# Patient Record
Sex: Male | Born: 1940 | Race: White | Hispanic: No | Marital: Single | Smoking: Never smoker
Health system: Southern US, Community
[De-identification: ages and names within clinical notes are randomized; demographics above are authoritative.]

---

## 2011-10-18 ENCOUNTER — Ambulatory Visit (INDEPENDENT_AMBULATORY_CARE_PROVIDER_SITE_OTHER): Payer: Medicare Other | Admitting: Family Medicine

## 2011-10-18 ENCOUNTER — Ambulatory Visit: Payer: Medicare Other

## 2011-10-18 VITALS — BP 122/72 | HR 76 | Temp 98.1°F | Resp 16 | Ht 70.5 in | Wt 173.0 lb

## 2011-10-18 DIAGNOSIS — M25561 Pain in right knee: Secondary | ICD-10-CM

## 2011-10-18 DIAGNOSIS — M25569 Pain in unspecified knee: Secondary | ICD-10-CM

## 2011-10-18 DIAGNOSIS — M25469 Effusion, unspecified knee: Secondary | ICD-10-CM

## 2011-10-18 MED ORDER — MELOXICAM 7.5 MG PO TABS: 7.5000 mg | ORAL_TABLET | Freq: Every day | ORAL | Status: AC

## 2011-10-18 NOTE — Progress Notes (Signed)
  Subjective:    Patient ID: Thomas Byrd, male    DOB: [birth year], 71 y.o.   MRN: [id number]  HPI Thomas Byrd is a 71 y.o. male Complains of R knee pain - 2 and 1/2 weeks ago - kneeling working on floors.  Pain next day, then persistent pain, swelling for past week.   Similar sx's in past with kneeling 3 years ago - resolved on own. Swelling worse past few days.   No fever, no rash.  Otherwise feeling well. .  Feels tight.  Last power walk 48 hours ago. No locking/giving way, but feels unstable with turning/twisting. No hx of gout.    Tx: none.  Usually active - power walk, weightlifting.    Review of Systems As above.    Objective:   Physical Exam  Constitutional: He is oriented to person, place, and time. He appears well-developed and well-nourished.  HENT:  Head: Normocephalic and atraumatic.  Eyes: EOM are normal. Pupils are equal, round, and reactive to light.  Pulmonary/Chest: Effort normal.  Musculoskeletal:       Right knee: He exhibits swelling. He exhibits no ecchymosis, no erythema, normal alignment, no LCL laxity and normal patellar mobility. tenderness found.       Legs: Neurological: He is alert and oriented to person, place, and time.  Skin: Skin is warm and dry. No rash noted.  Psychiatric: He has a normal mood and affect. His behavior is normal.   [facility] reading (PRIMARY) by  Dr. Neva Seat: R knee:  Slight DJD, and calcific changes of meniscus.      Assessment & Plan:  Thomas Byrd is a 71 y.o. male 1. Knee pain, right  DG Knee Complete 4 Views Right, meloxicam (MOBIC) 7.5 MG tablet  2. 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

## 2011-10-18 NOTE — Patient Instructions (Signed)
Try the ace bandage and relative rest for next few days.  mobic - once per day as needed.  Recheck in 4 days if not improving for possible injection. Return to the clinic or go to the nearest emergency room if any of your symptoms worsen or new symptoms occur.

## 2011-10-19 ENCOUNTER — Telehealth: Payer: Self-pay

## 2011-10-19 NOTE — Telephone Encounter (Signed)
Have given to Xray

## 2011-10-19 NOTE — Telephone Encounter (Signed)
The patient called to request that a copy (cd)  of the X-rays done 10/18/11 be mailed to his home since he lives in [place].  Please call the patient at [phone] with any questions.

## 2013-10-06 IMAGING — CR DG KNEE COMPLETE 4+V*R*
4 series · 4 of 4 positions shown · non-contrast
Comparison: None.

CLINICAL DATA: Right knee pain and swelling

RIGHT KNEE - COMPLETE 4+ VIEW

[AP]
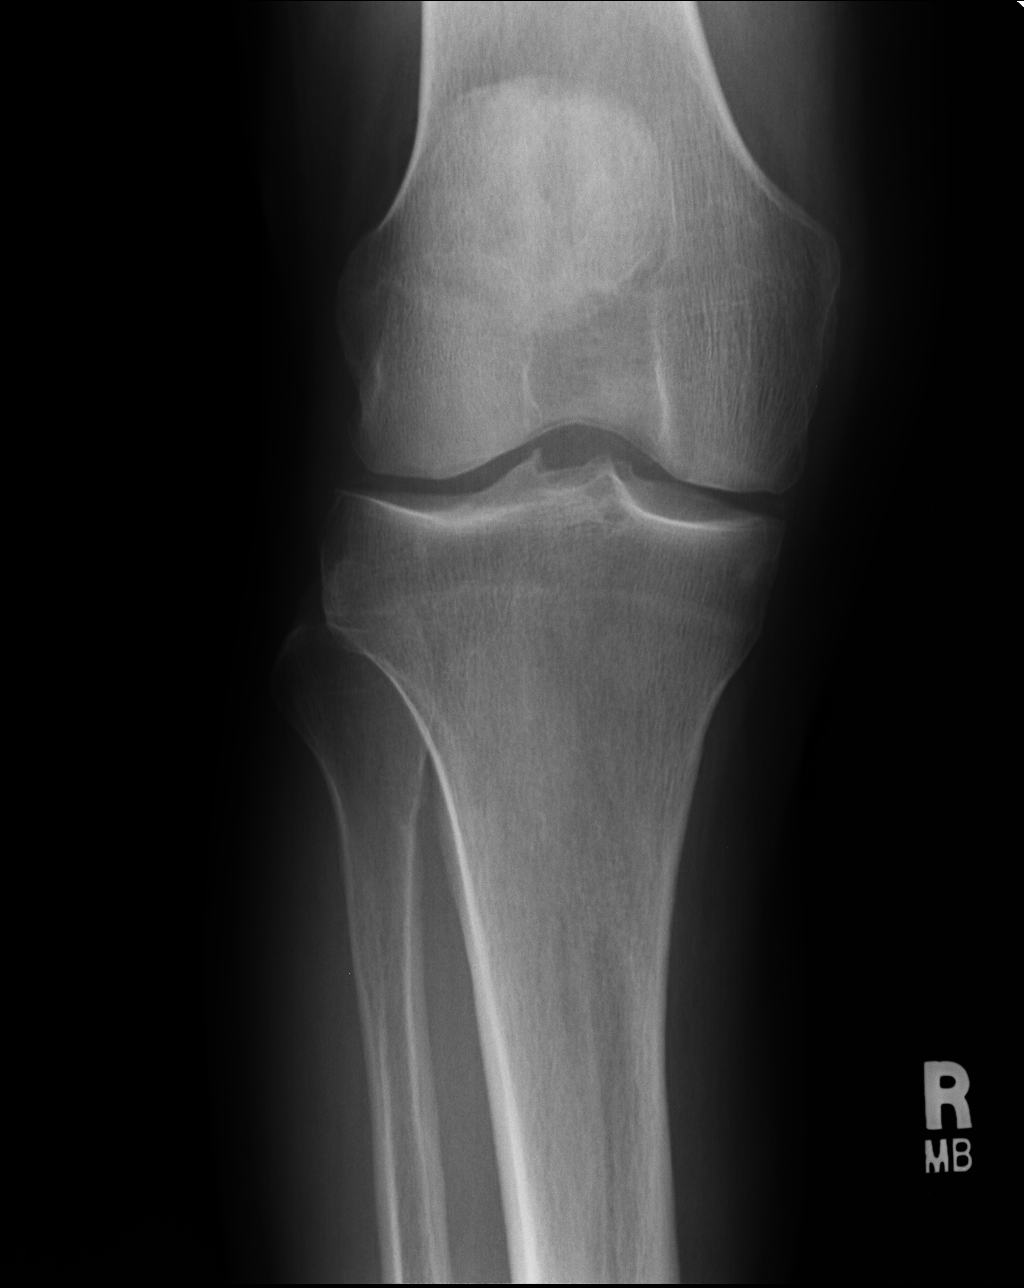

[lateral]
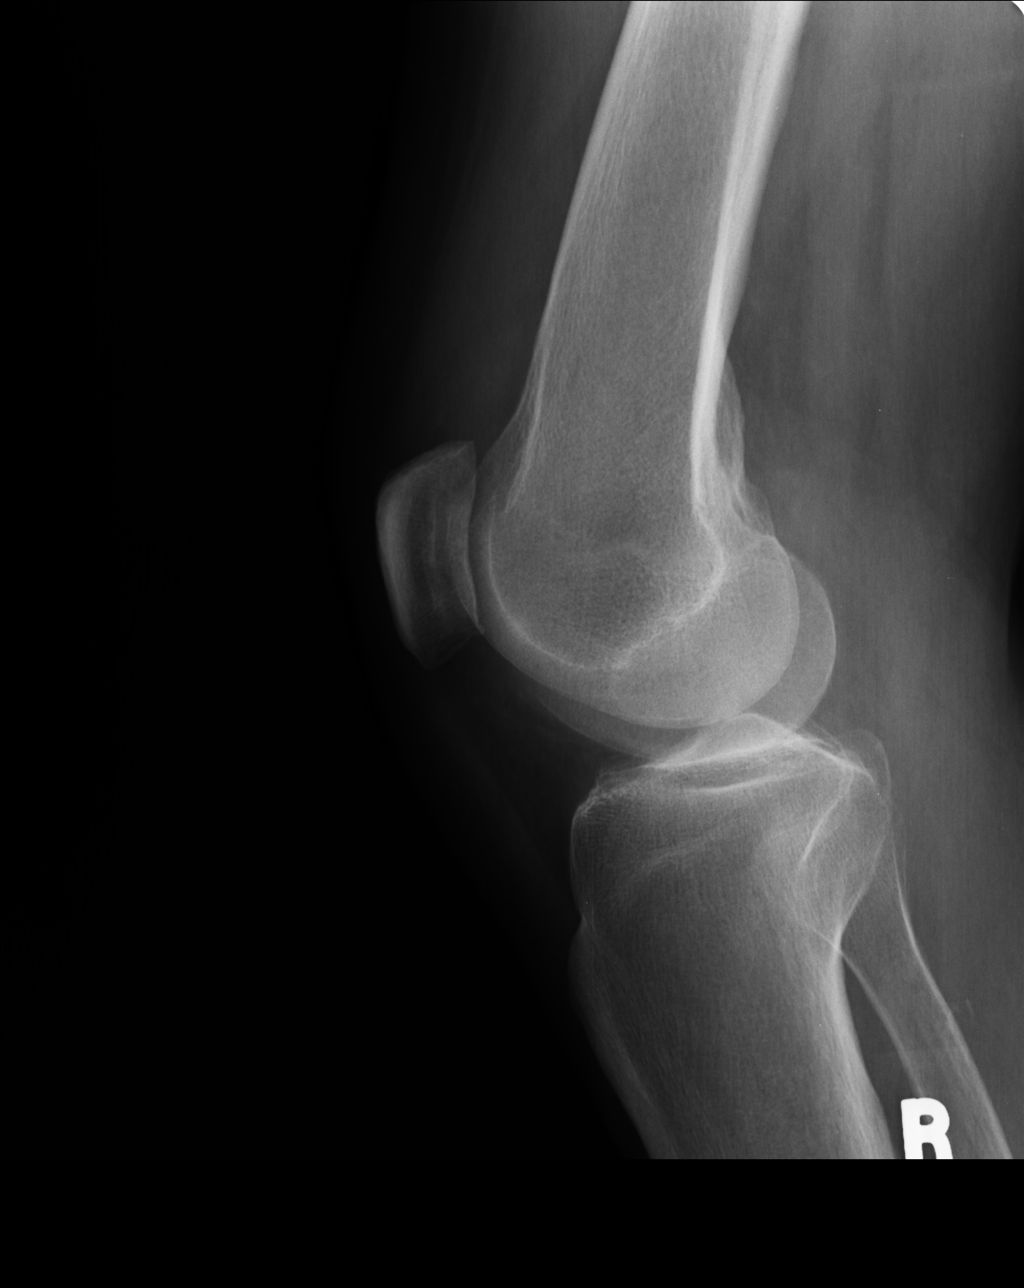

[ap ext rot]
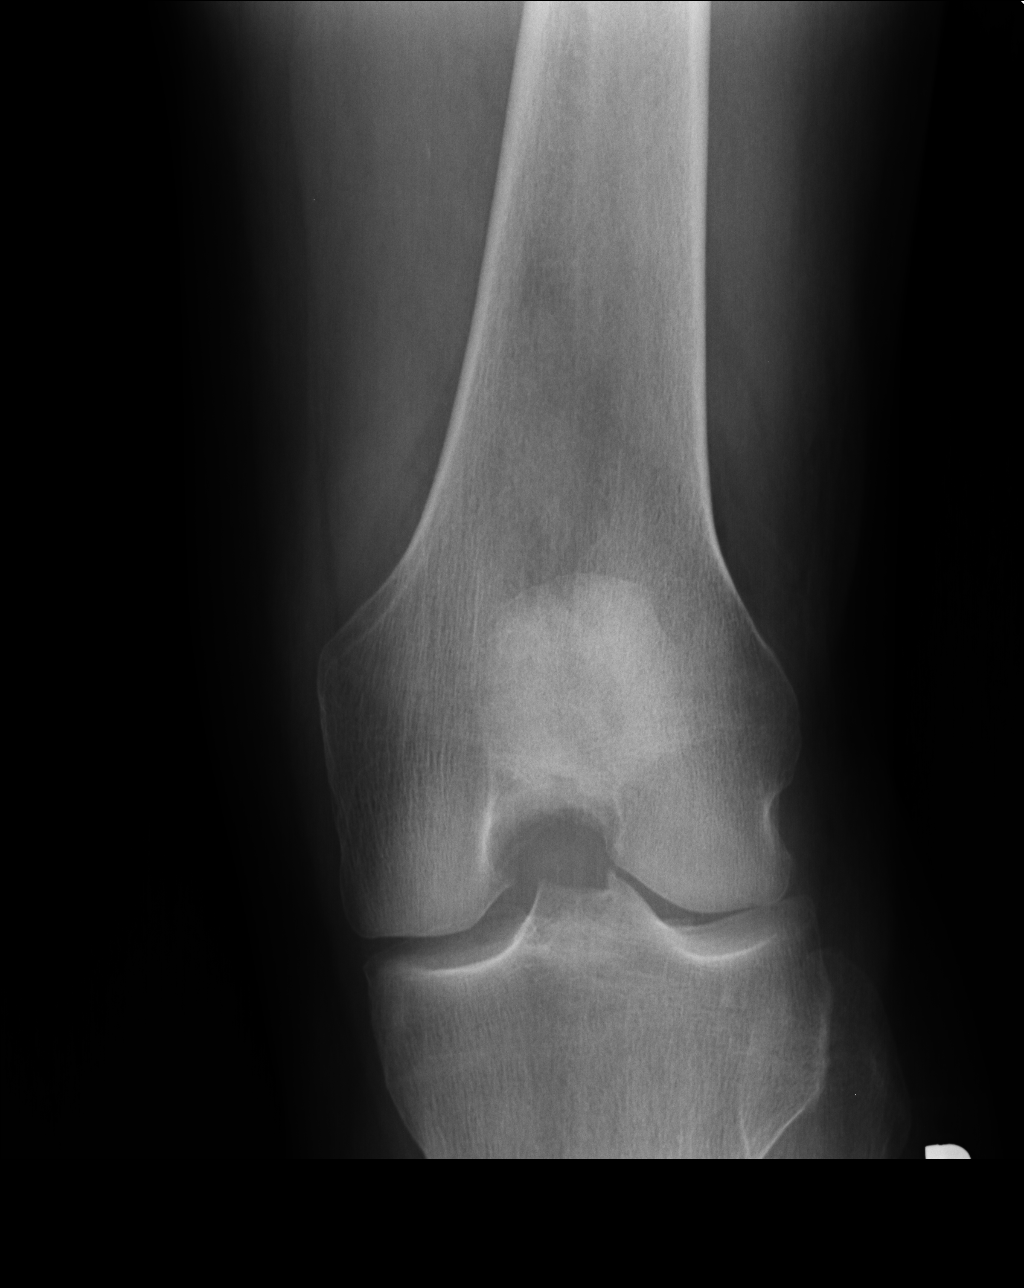

[ap int rot]
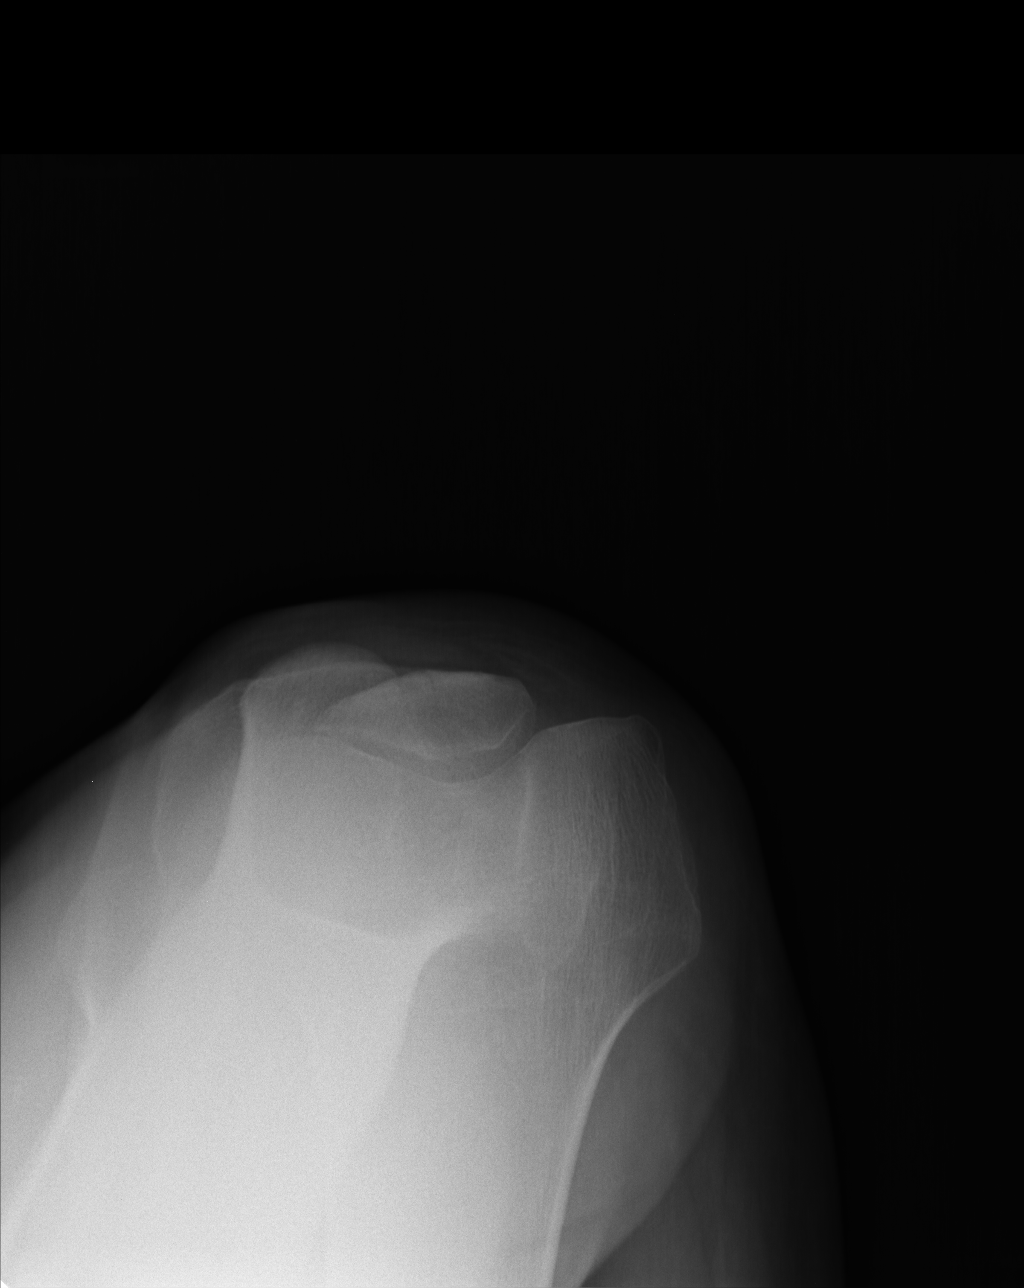

[4 of 4 positions shown; findings below may reference images not displayed]

FINDINGS: Mild tricompartment oral osteoarthritic changes including
peaking of the peaking of the tibial spines, small osteophyte
formation at the lateral tibial plateau, and osteophyte formation
along the lateral margin of the patella.  With thin linear
calcification in the region of the lateral meniscus consistent with
chondrocalcinosis.  There is a small ankle joint effusion present.
No acute fracture, malalignment or aggressive appearing lytic or
blastic osseous lesion.
IMPRESSION: 1.  No acute fracture, malalignment or aggressive appearing lytic
or blastic osseous lesion.
2.  Small knee joint effusion
3.  Mild tricompartmental total degenerative changes
4.  Trace lateral meniscus chondrocalcinosis

## 2021-10-18 ENCOUNTER — Ambulatory Visit (HOSPITAL_COMMUNITY)
Admission: EM | Admit: 2021-10-18 | Discharge: 2021-10-18 | Disposition: A | Discharge status: Home / Self Care | Payer: No Typology Code available for payment source | Attending: Emergency Medicine | Admitting: Emergency Medicine

## 2021-10-18 ENCOUNTER — Encounter (HOSPITAL_COMMUNITY): Payer: Self-pay

## 2021-10-18 DIAGNOSIS — R21 Rash and other nonspecific skin eruption: Secondary | ICD-10-CM

## 2021-10-18 MED ORDER — DOXYCYCLINE HYCLATE 100 MG PO CAPS: 100.0000 mg | ORAL_CAPSULE | Freq: Two times a day (BID) | ORAL | 0 refills | Status: AC

## 2021-10-18 MED ORDER — HYDROCORTISONE 1 % EX CREA: TOPICAL_CREAM | Freq: Two times a day (BID) | CUTANEOUS | 0 refills | Status: AC

## 2021-10-18 NOTE — Discharge Instructions (Addendum)
Apply the cream twice daily to the rash. If symptoms don't improve after 4-5 days, fill the paper prescription for the antibiotic.  Please return to urgent care with any concerns. Please go to the emergency department if symptoms worsen.

## 2021-10-18 NOTE — ED Triage Notes (Signed)
Pt has a rash on left leg that is swollen , slightly itchy x1 day

## 2021-10-19 NOTE — ED Provider Notes (Signed)
[facility]    CSN: [id number] Arrival date & time: 10/18/21  1906      History   Chief Complaint Chief Complaint  Patient presents with   Rash    HPI MARK GAVIA is a 81 y.o. male.  Presents with 1 day history of rash on the left lower leg Noticed it yesterday but not sure how long it was there Little bit itchy, no pain No known exposures or bites Has not tried any interventions  History reviewed. No pertinent past medical history.  There are no problems to display for this patient.  History reviewed. No pertinent surgical history.   Home Medications    Prior to Admission medications   Medication Sig Start Date End Date Taking? Authorizing Provider  doxycycline (VIBRAMYCIN) 100 MG capsule Take 1 capsule (100 mg total) by mouth 2 (two) times daily for 5 days. 10/18/21 10/23/21 Yes Misako Roeder, Wells Guiles, PA-C  hydrocortisone cream 1 % Apply topically 2 (two) times daily for 5 days. Apply to affected area 2 times daily 10/18/21 10/23/21 Yes Estephani Popper, Vernice Jefferson  aspirin 81 MG tablet Take 81 mg by mouth daily.    [provider]  meloxicam (MOBIC) 7.5 MG tablet Take 1 tablet (7.5 mg total) by mouth daily. 10/18/11   Wendie Agreste, MD  Multiple Vitamin (MULTIVITAMIN) tablet Take 1 tablet by mouth daily.    [provider]    Family History History reviewed. No pertinent family history.  Social History Social History   Tobacco Use   Smoking status: Never  Vaping Use   Vaping Use: Never used  Substance Use Topics   Alcohol use: Never   Drug use: Never     Allergies   Amoxicillin-pot clavulanate   Review of Systems Review of Systems  Skin:  Positive for rash.   Per HPI  Physical Exam Triage Vital Signs ED Triage Vitals  Enc Vitals Group     BP 10/18/21 2014 135/61     Pulse Rate 10/18/21 2014 70     Resp 10/18/21 2014 12     Temp 10/18/21 2014 98 F (36.7 C)     Temp Source 10/18/21 2014 Oral     SpO2 10/18/21 2014 97 %      Weight 10/18/21 2012 166 lb (75.3 kg)     Height 10/18/21 2012 5\' 10"  (1.778 m)     Head Circumference --      Peak Flow --      Pain Score 10/18/21 2012 0     Pain Loc --      Pain Edu? --      Excl. in [place]? --    No data found.  Updated Vital Signs BP 135/61 (BP Location: Right Arm)   Pulse 70   Temp 98 F (36.7 C) (Oral)   Resp 12   Ht 5\' 10"  (1.778 m)   Wt 166 lb (75.3 kg)   SpO2 97%   BMI 23.82 kg/m    Physical Exam Vitals and nursing note reviewed.  Constitutional:      General: He is not in acute distress.    Appearance: Normal appearance.  Cardiovascular:     Rate and Rhythm: Normal rate and regular rhythm.     Pulses: Normal pulses.     Heart sounds: Normal heart sounds.  Pulmonary:     Effort: Pulmonary effort is normal.     Breath sounds: Normal breath sounds.  Musculoskeletal:  General: Normal range of motion.  Skin:    Findings: Rash present.          Comments: 2 patches of red, macular rash with a few bumps.  Slightly warm compared to the rest of the leg.  No pain with palpation.  No swelling.  Neurological:     Mental Status: He is alert and oriented to person, place, and time.      UC Treatments / Results  Labs (all labs ordered are listed, but only abnormal results are displayed) Labs Reviewed - No data to display  EKG   Radiology No results found.  Procedures Procedures (including critical care time)  Medications Ordered in UC Medications - No data to display  Initial Impression / Assessment and Plan / UC Course  I have reviewed the triage vital signs and the nursing notes.  Pertinent labs & imaging results that were available during my care of the patient were reviewed by me and considered in my medical decision making (see chart for details).  Unknown etiology Recommend trying hydrocortisone cream twice daily He is starting a steroid taper from his primary care for respiratory infection so maybe combination of  these will improve rash. Does not appear to be cellulitis at this time but warmth and erythema present, discussed using the steroid cream for 5 days and if no improvement he can fill the paper prescription for doxycycline.  Discussed following up with urgent care as needed, primary care. Patient agrees to plan  Final Clinical Impressions(s) / UC Diagnoses   Final diagnoses:  Rash     Discharge Instructions      Apply the cream twice daily to the rash. If symptoms don't improve after 4-5 days, fill the paper prescription for the antibiotic.  Please return to urgent care with any concerns. Please go to the emergency department if symptoms worsen.    ED Prescriptions     Medication Sig Dispense Auth. Provider   hydrocortisone cream 1 % Apply topically 2 (two) times daily for 5 days. Apply to affected area 2 times daily 15 g Odies Desa, PA-C   doxycycline (VIBRAMYCIN) 100 MG capsule Take 1 capsule (100 mg total) by mouth 2 (two) times daily for 5 days. 10 capsule Starleen Trussell, Wells Guiles, PA-C      PDMP not reviewed this encounter.   Kyra Leyland 10/19/21 0263
